# Patient Record
Sex: Male | Born: 1980 | Hispanic: Yes | Marital: Married | Smoking: Never smoker
Health system: Southern US, Community
[De-identification: ages and names within clinical notes are randomized; demographics above are authoritative.]

---

## 2018-10-28 DIAGNOSIS — G8929 Other chronic pain: Secondary | ICD-10-CM | POA: Insufficient documentation

## 2018-11-18 DIAGNOSIS — M5136 Other intervertebral disc degeneration, lumbar region: Secondary | ICD-10-CM | POA: Insufficient documentation

## 2018-11-18 DIAGNOSIS — M5030 Other cervical disc degeneration, unspecified cervical region: Secondary | ICD-10-CM | POA: Insufficient documentation

## 2018-12-12 ENCOUNTER — Other Ambulatory Visit: Payer: Self-pay

## 2018-12-12 ENCOUNTER — Ambulatory Visit (INDEPENDENT_AMBULATORY_CARE_PROVIDER_SITE_OTHER): Payer: BC Managed Care – PPO | Admitting: Orthopaedic Surgery

## 2018-12-12 ENCOUNTER — Encounter: Payer: Self-pay | Admitting: Orthopaedic Surgery

## 2018-12-12 ENCOUNTER — Ambulatory Visit: Payer: Self-pay

## 2018-12-12 VITALS — BP 133/76 | HR 70 | Ht 64.0 in | Wt 148.0 lb

## 2018-12-12 DIAGNOSIS — G8929 Other chronic pain: Secondary | ICD-10-CM

## 2018-12-12 DIAGNOSIS — M545 Low back pain: Secondary | ICD-10-CM

## 2018-12-12 DIAGNOSIS — M542 Cervicalgia: Secondary | ICD-10-CM | POA: Diagnosis not present

## 2018-12-16 NOTE — Progress Notes (Signed)
Office Visit Note   Patient: Jack Hernandez           Date of Birth: [birth year]           MRN: [id number] Visit Date: 12/12/2018              Requested by: No referring provider defined for this encounter. PCP: Patient, No Pcp Per   Assessment & Plan: Visit Diagnoses:  1. Neck pain, chronic   2. Chronic midline low back pain without sciatica     Plan: We discussed maximum dosages recommended for Tylenol and ibuprofen.  Reviewed findings of his radiographs today and previous MRI reports.  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  We discussed looking for work activity that is less stressful for both his back as well as his neck.  If he develops progressive symptoms he would require reimaging.  We discussed the multiple levels involved with evidence of disc degeneration at his young age.  If he develops progressive symptoms he can return to consider reimaging.  Pathophysiology discussed.  Follow-Up Instructions: Return if symptoms worsen or fail to improve.   Orders:  Orders Placed This Encounter  Procedures  . XR Lumbar Spine 2-3 Views  . XR Cervical Spine 2 or 3 views   No orders of the defined types were placed in this encounter.     Procedures: No procedures performed   Clinical Data: No additional findings.   Subjective: Chief Complaint  Patient presents with  . Lower Back - Pain  . Neck - Pain    HPI 38 year old male seen with plaints of chronic back pain and chronic neck pain.  He states he had an [place] lumbar in 2017 [place].  He has had plain radiographs done at [facility].  He is used Tylenol and Naprosyn as well as cannabis oil for treatment.  Patient is a non-smoker.  He does have a history of alcoholism and depression.  He works in [employer].  He has discomfort with bending lifting. MRI report from [facility] done on 09/10/2015 showed left  foraminal disc herniation L3-4.  Disc bulge L4-5 with broad-based disc herniation protrusion into the right neural foramina moderate to severe right foraminal stenosis.  L5-S1 chronic disc bulge with posterior disc herniation indenting the ventral epidural fat with dorsal annular tear moderate neuroforaminal stenosis with facet arthropathy. MRI cervical spine 08/1415 showed C5-6 disc bulge with moderate bilateral neuroforaminal stenosis and C6-7 circumferential disc bulge with moderate to severe bilateral neuroforaminal stenosis.  Small disc bulge noted at C3-4 without compression. Patient denies fever or chills.  Review of Systems 14 point systems positive for chronic neck and low back pain.  Disc degeneration.  History of alcoholism and depression.  Otherwise negative as it pertains HPI.   Objective: Vital Signs: BP 133/76   Pulse 70   Ht 5\' 4"  (1.626 m)   Wt 148 lb (67.1 kg)   BMI 25.40 kg/m   Physical Exam Constitutional:      Appearance: He is well-developed.  HENT:     Head: Normocephalic and atraumatic.  Eyes:     Pupils: Pupils are equal, round, and reactive to light.  Neck:     Thyroid: No thyromegaly.     Trachea: No tracheal deviation.  Cardiovascular:     Rate and Rhythm: Normal rate.  Pulmonary:     Effort: Pulmonary effort is  normal.     Breath sounds: No wheezing.  Abdominal:     General: Bowel sounds are normal.     Palpations: Abdomen is soft.  Skin:    General: Skin is warm and dry.     Capillary Refill: Capillary refill takes less than 2 seconds.  Neurological:     Mental Status: He is alert and oriented to person, place, and time.  Psychiatric:        Behavior: Behavior normal.        Thought Content: Thought content normal.        Judgment: Judgment normal.     Ortho Exam patient has some brachial plexus tenderness both right and left negative Spurling upper extremity reflexes are 2+ and symmetrical no isolated motor weakness the upper extremities no  impingement of the shoulders.  He has some sciatic notch tenderness worse on the left than right negative straight leg raising 90 degrees he is able to heel and toe walk.  Anterior tib gastrocsoleus is strong no atrophy.  Distal pulses are 2+ and symmetrical.  Specialty Comments:  No specialty comments available.  Imaging: No results found.   PMFS History: Patient Active Problem List   Diagnosis Date Noted  . DDD (degenerative disc disease), cervical 11/18/2018  . Degeneration of lumbar intervertebral disc 11/18/2018  . Chronic low back pain 10/28/2018  . Chronic neck pain 10/28/2018   History reviewed. No pertinent past medical history.  History reviewed. No pertinent family history.  History reviewed. No pertinent surgical history. Social History   Occupational History  . Not on file  Tobacco Use  . Smoking status: Never Smoker  . Smokeless tobacco: Never Used  Substance and Sexual Activity  . Alcohol use: Not on file  . Drug use: Not on file  . Sexual activity: Not on file

## 2019-01-10 ENCOUNTER — Telehealth: Payer: Self-pay | Admitting: Radiology

## 2019-01-10 DIAGNOSIS — G8929 Other chronic pain: Secondary | ICD-10-CM

## 2019-01-10 NOTE — Telephone Encounter (Signed)
OK thanks ucall and see if cervical or lumbar.

## 2019-01-10 NOTE — Telephone Encounter (Signed)
Patient called and states that he was told to call back if he was getting no better and we would order MRI. He would like for this order to be entered.  He is not claustrophobic and does not have any metal that would prohibit the scan. He would like scan to be done at [facility].  Please advise. CB for patient is [phone]

## 2019-01-14 NOTE — Telephone Encounter (Signed)
I left message requesting return call from patient to advise whether neck or low back. Patient can leave message for me and I will enter order and Forestine Na will call to schedule.

## 2019-01-21 NOTE — Addendum Note (Signed)
Addended by: Meyer Cory on: 01/21/2019 04:48 PM   Modules accepted: Orders

## 2019-01-21 NOTE — Telephone Encounter (Signed)
I left voicemail again requesting return call. Will wait for call back.

## 2019-01-21 NOTE — Telephone Encounter (Signed)
Patient returned call and requests MRI on Lumbar Spine. Order entered for [facility].

## 2019-01-30 ENCOUNTER — Ambulatory Visit (HOSPITAL_COMMUNITY): Payer: BC Managed Care – PPO | Attending: Orthopaedic Surgery

## 2019-02-26 ENCOUNTER — Ambulatory Visit (HOSPITAL_COMMUNITY): Payer: BC Managed Care – PPO

## 2019-02-27 ENCOUNTER — Ambulatory Visit (HOSPITAL_COMMUNITY)
Admission: RE | Admit: 2019-02-27 | Discharge: 2019-02-27 | Disposition: A | Discharge status: Home / Self Care | Payer: BC Managed Care – PPO | Source: Ambulatory Visit | Attending: Orthopaedic Surgery | Admitting: Orthopaedic Surgery

## 2019-02-27 ENCOUNTER — Other Ambulatory Visit: Payer: Self-pay

## 2019-02-27 DIAGNOSIS — M545 Low back pain: Secondary | ICD-10-CM | POA: Diagnosis present

## 2019-02-27 DIAGNOSIS — G8929 Other chronic pain: Secondary | ICD-10-CM | POA: Insufficient documentation

## 2019-04-29 ENCOUNTER — Encounter: Payer: Self-pay | Admitting: Orthopaedic Surgery

## 2019-04-29 ENCOUNTER — Ambulatory Visit (INDEPENDENT_AMBULATORY_CARE_PROVIDER_SITE_OTHER): Payer: BC Managed Care – PPO | Admitting: Orthopaedic Surgery

## 2019-04-29 ENCOUNTER — Other Ambulatory Visit: Payer: Self-pay

## 2019-04-29 ENCOUNTER — Ambulatory Visit: Payer: Self-pay

## 2019-04-29 VITALS — Ht 64.0 in | Wt 148.0 lb

## 2019-04-29 DIAGNOSIS — M545 Low back pain, unspecified: Secondary | ICD-10-CM

## 2019-04-29 DIAGNOSIS — M5126 Other intervertebral disc displacement, lumbar region: Secondary | ICD-10-CM | POA: Insufficient documentation

## 2019-04-29 DIAGNOSIS — G8929 Other chronic pain: Secondary | ICD-10-CM | POA: Diagnosis not present

## 2019-04-29 DIAGNOSIS — M25552 Pain in left hip: Secondary | ICD-10-CM | POA: Diagnosis not present

## 2019-04-29 NOTE — Progress Notes (Addendum)
Office Visit Note   Patient: Jack Hernandez           Date of Birth: 08-15-1980           MRN: [id number] Visit Date: 04/29/2019              Requested by: No referring provider defined for this encounter. PCP: Patient, No Pcp Per   Assessment & Plan: Visit Diagnoses:  1. Pain in left hip   2. Chronic left-sided low back pain, unspecified whether sciatica present   3. Protrusion of lumbar intervertebral disc     Plan: X-rays were obtained of the hip since he had limitation of internal rotation and pain.  This showed normal hip joint.  We reviewed the MRI scan with him I gave him a copy of his report.  He has some Schmorl's nodes that are present with early disc degeneration and edema in the vertebral endplates which may be contributing to his back pain.  At L5-S1 there is a central disc protrusion worse on the left adjacent to the S1 nerve root which may be the source of his back and left buttocks pain.  We will set him up for single epidural injection foraminal L5 with Dr. Alvester Morin and I can follow-up with him in 2 months.  We discussed disc degeneration.  Currently no surgery is indicated.  He will continue the anti-inflammatories and Tylenol.  Follow-Up Instructions: Return in about 2 months (around 06/30/2019).   Orders:  Orders Placed This Encounter  Procedures  . XR HIP UNILAT W OR W/O PELVIS 2-3 VIEWS LEFT  . Ambulatory referral to Physical Medicine Rehab   No orders of the defined types were placed in this encounter.     Procedures: No procedures performed   Clinical Data: No additional findings.   Subjective: Chief Complaint  Patient presents with  . Lower Back - Pain, Follow-up    MRI review    HPI 38 year old male returns with ongoing problems with chronic back pain.  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  Previous MRI 2017 showed some  foraminal protrusion at L3-4 disc bulge at L4-5 chronic disc bulge L5S1.  He returns today for follow-up new lumbar MRI scan for evaluation.  He continues to have neck pain symptoms and has some disc bulging noted on previous cervical MRI scan.  Review of Systems positive chronic neck and back pain.  History of alcoholism history of depression.  Negative for MI, chest pain, negative for stroke.   Objective: Vital Signs: Ht 5\' 4"  (1.626 m)   Wt 148 lb (67.1 kg)   BMI 25.40 kg/m   Physical Exam Constitutional:      Appearance: He is well-developed.  HENT:     Head: Normocephalic and atraumatic.  Eyes:     Pupils: Pupils are equal, round, and reactive to light.  Neck:     Thyroid: No thyromegaly.     Trachea: No tracheal deviation.  Cardiovascular:     Rate and Rhythm: Normal rate.  Pulmonary:     Effort: Pulmonary effort is normal.     Breath sounds: No wheezing.  Abdominal:     General: Bowel sounds are normal.     Palpations: Abdomen is soft.  Skin:    General: Skin is warm and dry.     Capillary Refill: Capillary refill takes less  than 2 seconds.  Neurological:     Mental Status: He is alert and oriented to person, place, and time.  Psychiatric:        Behavior: Behavior normal.        Thought Content: Thought content normal.        Judgment: Judgment normal.     Ortho Exam patient is ambulate with a slightly short stride gait.  He has only 10 degrees internal rotation left hip with significant pain.  Minimal trochanteric bursal tenderness no sciatic notch tenderness negative straight leg raising 90 degrees on the left and right.  Knee and ankle jerk are intact. Opposite right hip has 30 degrees internal rotation without pain and 45 degrees external rotation without discomfort. Specialty Comments:  No specialty comments available.  Imaging: CLINICAL DATA:  Initial evaluation for chronic low back pain with left hip and leg pain for 3 years.  EXAM: MRI LUMBAR SPINE  WITHOUT CONTRAST  TECHNIQUE: Multiplanar, multisequence MR imaging of the lumbar spine was performed. No intravenous contrast was administered.  COMPARISON:  Prior radiograph from 12/12/2018.  FINDINGS: Segmentation: Standard. Lowest well-formed disc labeled the L5-S1 level.  Alignment: Straightening of the normal lumbar lordosis. No listhesis.  Vertebrae: Vertebral body height maintained without evidence for acute or chronic fracture. Multiple endplate Schmorl's nodes with associated reactive marrow edema seen throughout the lumbar spine, most notable at L2-3 and L4-5. Underlying bone marrow signal intensity within normal limits. No discrete or worrisome osseous lesion. No other abnormal marrow edema.  Conus medullaris and cauda equina: Conus extends to the L1 level. Conus and cauda equina appear normal.  Paraspinal and other soft tissues: Paraspinous soft tissues within normal limits. Visualized visceral structures unremarkable.  Disc levels:  L1-2:  Unremarkable.  L2-3: Mild intervertebral disc space narrowing with disc bulge and disc desiccation. Prominent endplate Schmorl's node deformities. No focal disc herniation. No stenosis or impingement.  L3-4: Mild intervertebral disc space narrowing with circumferential disc bulge and disc desiccation. Mild endplate Schmorl's node deformity. No focal disc herniation. Mild facet hypertrophy. No canal or foraminal stenosis.  L4-5: Mild diffuse disc bulge with disc desiccation and intervertebral disc space narrowing. Endplate Schmorl's node deformity. Mild facet hypertrophy. No significant canal or foraminal stenosis.  L5-S1: Chronic intervertebral disc space narrowing with diffuse disc bulge and disc desiccation. Superimposed small central disc protrusion, slightly asymmetric to the left. Protruding disc closely approximates the descending S1 nerve roots without frank neural impingement or displacement,  slightly greater on the left. No canal or significant lateral recess stenosis. Mild bilateral L5 foraminal narrowing.  IMPRESSION: 1. Small central disc protrusion at L5-S1, closely approximating the descending S1 nerve roots without frank neural impingement or displacement, slightly greater on the left. 2. Additional mild noncompressive disc bulging at L2-3 through L4-5 without significant stenosis or impingement. 3. Prominent endplate Schmorl's node deformities throughout the lumbar spine, most notable at L2-3 and L4-5. Finding could contribute to underlying lower back pain.   Electronically Signed   By: Jeannine Boga M.D.   On: 02/27/2019 19:58   PMFS History: Patient Active Problem List   Diagnosis Date Noted  . Protrusion of lumbar intervertebral disc 04/29/2019  . DDD (degenerative disc disease), cervical 11/18/2018  . Degeneration of lumbar intervertebral disc 11/18/2018  . Chronic low back pain 10/28/2018  . Chronic neck pain 10/28/2018   No past medical history on file.  No family history on file.  No past surgical history on file. Social History   Occupational  History  . Not on file  Tobacco Use  . Smoking status: Never Smoker  . Smokeless tobacco: Never Used  Substance and Sexual Activity  . Alcohol use: Not on file  . Drug use: Not on file  . Sexual activity: Not on file

## 2019-05-27 ENCOUNTER — Ambulatory Visit: Payer: Self-pay

## 2019-05-27 ENCOUNTER — Other Ambulatory Visit: Payer: Self-pay

## 2019-05-27 ENCOUNTER — Encounter: Payer: Self-pay | Admitting: Physical Medicine and Rehabilitation

## 2019-05-27 ENCOUNTER — Ambulatory Visit (INDEPENDENT_AMBULATORY_CARE_PROVIDER_SITE_OTHER): Payer: BC Managed Care – PPO | Admitting: Physical Medicine and Rehabilitation

## 2019-05-27 VITALS — BP 110/69 | HR 63

## 2019-05-27 DIAGNOSIS — M48061 Spinal stenosis, lumbar region without neurogenic claudication: Secondary | ICD-10-CM

## 2019-05-27 DIAGNOSIS — M5416 Radiculopathy, lumbar region: Secondary | ICD-10-CM

## 2019-05-27 MED ORDER — BETAMETHASONE SOD PHOS & ACET 6 (3-3) MG/ML IJ SUSP
12.0000 mg | Freq: Once | INTRAMUSCULAR | Status: AC
  Administered 2019-05-27: 12 mg

## 2019-05-27 NOTE — Progress Notes (Signed)
 .  Numeric Pain Rating Scale and Functional Assessment Average Pain 6   In the last MONTH (on 0-10 scale) has pain interfered with the following?  1. General activity like being  able to carry out your everyday physical activities such as walking, climbing stairs, carrying groceries, or moving a chair?  Rating(6)   +Driver, -BT, -Dye Allergies.  

## 2019-05-28 NOTE — Progress Notes (Signed)
Jack Hernandez - 38 y.o. male MRN [id number]  Date of birth: 05/07/1981  Office Visit Note: Visit Date: 05/27/2019 PCP: Patient, No Pcp Per Referred by: Marybelle Killings, MD  Subjective: Chief Complaint  Patient presents with  . Lower Back - Pain  . Left Thigh - Pain   HPI:  Jack Hernandez is a 38 y.o. male who comes in today For planned left L5 transforaminal epidural steroid injection at the request of Dr. Leana Gamer.  Patient having chronic worsening low back with left buttock and hip pain.  MRI evidence of foraminal narrowing at L5-S1.  Has failed conservative care otherwise.  Injection will be diagnostic and hopefully therapeutic.  ROS Otherwise per HPI.  Assessment & Plan: Visit Diagnoses:  1. Lumbar radiculopathy   2. Foraminal stenosis of lumbar region     Plan: No additional findings.   Meds & Orders:  Meds ordered this encounter  Medications  . betamethasone acetate-betamethasone sodium phosphate (CELESTONE) injection 12 mg    Orders Placed This Encounter  Procedures  . XR C-ARM NO REPORT  . Epidural Steroid injection    Follow-up: Return in about 1 month (around 06/27/2019) for Jack Perna, MD.   Procedures: No procedures performed  Lumbosacral Transforaminal Epidural Steroid Injection - Sub-Pedicular Approach with Fluoroscopic Guidance  Patient: Jack Hernandez      Date of Birth: 1980-07-11 MRN: [id number] PCP: Patient, No Pcp Per      Visit Date: 05/27/2019   Universal Protocol:    Date/Time: 05/27/2019  Consent Given By: the patient  Position: PRONE  Additional Comments: Vital signs were monitored before and after the procedure. Patient was prepped and draped in the usual sterile fashion. The correct patient, procedure, and site was verified.   Injection Procedure Details:  Procedure Site One Meds Administered:  Meds ordered this encounter  Medications  . betamethasone acetate-betamethasone sodium phosphate (CELESTONE) injection 12 mg     Laterality: Left  Location/Site:  L5-S1  Needle size: 22 G  Needle type: Spinal  Needle Placement: Transforaminal  Findings:    -Comments: Excellent flow of contrast along the nerve and into the epidural space.  Procedure Details: After squaring off the end-plates to get a true AP view, the C-arm was positioned so that an oblique view of the foramen as noted above was visualized. The target area is just inferior to the "nose of the scotty dog" or sub pedicular. The soft tissues overlying this structure were infiltrated with 2-3 ml. of 1% Lidocaine without Epinephrine.  The spinal needle was inserted toward the target using a "trajectory" view along the fluoroscope beam.  Under AP and lateral visualization, the needle was advanced so it did not puncture dura and was located close the 6 O'Clock position of the pedical in AP tracterory. Biplanar projections were used to confirm position. Aspiration was confirmed to be negative for CSF and/or blood. A 1-2 ml. volume of Isovue-250 was injected and flow of contrast was noted at each level. Radiographs were obtained for documentation purposes.   After attaining the desired flow of contrast documented above, a 0.5 to 1.0 ml test dose of 0.25% Marcaine was injected into each respective transforaminal space.  The patient was observed for 90 seconds post injection.  After no sensory deficits were reported, and normal lower extremity motor function was noted,   the above injectate was administered so that equal amounts of the injectate were placed at each foramen (level) into the transforaminal epidural space.   Additional Comments:  The patient tolerated the procedure well Dressing: 2 x 2 sterile gauze and Band-Aid    Post-procedure details: Patient was observed during the procedure. Post-procedure instructions were reviewed.  Patient left the clinic in stable condition.      Clinical History: No specialty comments available.      Objective:  VS:  HT:    WT:   BMI:     BP:110/69  HR:63bpm  TEMP: ( )  RESP:  Physical Exam  Ortho Exam Imaging: XR C-ARM NO REPORT  Result Date: 05/27/2019 Please see Notes tab for imaging impression.

## 2019-05-28 NOTE — Procedures (Signed)
Lumbosacral Transforaminal Epidural Steroid Injection - Sub-Pedicular Approach with Fluoroscopic Guidance  Patient: Jack Hernandez      Date of Birth: 15-Mar-1981 MRN: [id number] PCP: Patient, No Pcp Per      Visit Date: 05/27/2019   Universal Protocol:    Date/Time: 05/27/2019  Consent Given By: the patient  Position: PRONE  Additional Comments: Vital signs were monitored before and after the procedure. Patient was prepped and draped in the usual sterile fashion. The correct patient, procedure, and site was verified.   Injection Procedure Details:  Procedure Site One Meds Administered:  Meds ordered this encounter  Medications  . betamethasone acetate-betamethasone sodium phosphate (CELESTONE) injection 12 mg    Laterality: Left  Location/Site:  L5-S1  Needle size: 22 G  Needle type: Spinal  Needle Placement: Transforaminal  Findings:    -Comments: Excellent flow of contrast along the nerve and into the epidural space.  Procedure Details: After squaring off the end-plates to get a true AP view, the C-arm was positioned so that an oblique view of the foramen as noted above was visualized. The target area is just inferior to the "nose of the scotty dog" or sub pedicular. The soft tissues overlying this structure were infiltrated with 2-3 ml. of 1% Lidocaine without Epinephrine.  The spinal needle was inserted toward the target using a "trajectory" view along the fluoroscope beam.  Under AP and lateral visualization, the needle was advanced so it did not puncture dura and was located close the 6 O'Clock position of the pedical in AP tracterory. Biplanar projections were used to confirm position. Aspiration was confirmed to be negative for CSF and/or blood. A 1-2 ml. volume of Isovue-250 was injected and flow of contrast was noted at each level. Radiographs were obtained for documentation purposes.   After attaining the desired flow of contrast documented above, a 0.5 to  1.0 ml test dose of 0.25% Marcaine was injected into each respective transforaminal space.  The patient was observed for 90 seconds post injection.  After no sensory deficits were reported, and normal lower extremity motor function was noted,   the above injectate was administered so that equal amounts of the injectate were placed at each foramen (level) into the transforaminal epidural space.   Additional Comments:  The patient tolerated the procedure well Dressing: 2 x 2 sterile gauze and Band-Aid    Post-procedure details: Patient was observed during the procedure. Post-procedure instructions were reviewed.  Patient left the clinic in stable condition.

## 2019-06-27 ENCOUNTER — Ambulatory Visit: Payer: BC Managed Care – PPO | Admitting: Orthopaedic Surgery

## 2019-06-27 ENCOUNTER — Other Ambulatory Visit: Payer: Self-pay

## 2019-06-27 VITALS — BP 119/71 | HR 57 | Ht 64.0 in | Wt 148.0 lb

## 2019-06-27 DIAGNOSIS — M5136 Other intervertebral disc degeneration, lumbar region: Secondary | ICD-10-CM | POA: Diagnosis not present

## 2019-06-27 NOTE — Progress Notes (Signed)
Office Visit Note   Patient: Jack Hernandez           Date of Birth: 1980/09/20           MRN: [id number] Visit Date: 06/27/2019              Requested by: No referring provider defined for this encounter. PCP: Patient, No Pcp Per   Assessment & Plan: Visit Diagnoses:  1. Degeneration of lumbar intervertebral disc     Plan: Patient has lumbar disc degeneration without compressive stenosis.  L2-3 and L3-4 shows edema of the endplates.  He has a small disc protrusion L5-S1 worse on the left but the injection did not help him.  When he goes back to therapy could try TENS unit again and then purchase TENS unit that he could use at home on his own intermittently.  At this point no surgery is recommended.  Follow-up as needed.  We discussed looking for work activity being he can do some sitting from standing moving around but avoiding repetitive bending and twisting.  Follow-Up Instructions: Return if symptoms worsen or fail to improve.   Orders:  No orders of the defined types were placed in this encounter.  No orders of the defined types were placed in this encounter.     Procedures: No procedures performed   Clinical Data: No additional findings.   Subjective: Chief Complaint  Patient presents with  . 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  He had an epidural 05/27/2019 and got no relief.  This was on the left at L5-S1 he primarily has left side buttocks pain does not radiate down his leg.  Previous MRI 02/27/2019 showed small central disc protrusion L5S1 more prominent on the left than right.  He also had some minimal disc bulge and endplate edema around Schmorl's nodes at L2-3 and L3-4 without stenosis, central lateral or foraminal.  He has been through therapy and goes back whenever he is having increased problems.  He has increased problems when he sits he does better when he moves around changes positions.  He gets relief in supine  position.  No bowel bladder symptoms no chills or fever.  Review of Systems 14 point update unchanged from 04/29/2019 visit.   Objective: Vital Signs: BP 119/71   Pulse (!) 57   Ht 5\' 4"  (1.626 m)   Wt 148 lb (67.1 kg)   BMI 25.40 kg/m   Physical Exam Constitutional:      Appearance: He is well-developed.  HENT:     Head: Normocephalic and atraumatic.  Eyes:     Pupils: Pupils are equal, round, and reactive to light.  Neck:     Thyroid: No thyromegaly.     Trachea: No tracheal deviation.  Cardiovascular:     Rate and Rhythm: Normal rate.  Pulmonary:     Effort: Pulmonary effort is normal.     Breath sounds: No wheezing.  Abdominal:     General: Bowel sounds are normal.     Palpations: Abdomen is soft.  Skin:    General: Skin is warm and dry.     Capillary Refill: Capillary refill takes less than 2 seconds.  Neurological:     Mental Status: He is alert and oriented to person, place, and time.  Psychiatric:        Behavior: Behavior normal.        Thought Content: Thought content normal.  Judgment: Judgment normal.     Ortho Exam patient is able to get from sitting to standing he does heel and toe walking without weakness.  Increased discomfort with forward bending which is the left paralumbar region L4-S1.  Negative straight leg raising 90 degrees.  No atrophy.  No rash over exposed skin.  Increased discomfort with turning twisting.  Specialty Comments:  No specialty comments available.  Imaging: No results found.   PMFS History: Patient Active Problem List   Diagnosis Date Noted  . Protrusion of lumbar intervertebral disc 04/29/2019  . DDD (degenerative disc disease), cervical 11/18/2018  . Degeneration of lumbar intervertebral disc 11/18/2018  . Chronic low back pain 10/28/2018  . Chronic neck pain 10/28/2018   No past medical history on file.  No family history on file.  No past surgical history on file. Social History   Occupational History   . Not on file  Tobacco Use  . Smoking status: Never Smoker  . Smokeless tobacco: Never Used  Substance and Sexual Activity  . Alcohol use: Not on file  . Drug use: Not on file  . Sexual activity: Not on file

## 2019-11-27 IMAGING — MR MR LUMBAR SPINE W/O CM
4 series · 12 of 48 positions shown · non-contrast
Comparison: Prior radiograph from 12/12/2018.

CLINICAL DATA: Initial evaluation for chronic low back pain with
left hip and leg pain for 3 years.

EXAM:
MRI LUMBAR SPINE WITHOUT CONTRAST
TECHNIQUE: Multiplanar, multisequence MR imaging of the lumbar spine was
performed. No intravenous contrast was administered.

[Series 3: T2 · sagittal · 4.0mm · 0.42mm/px · 3 of 15 slices shown]
[im 2/15]
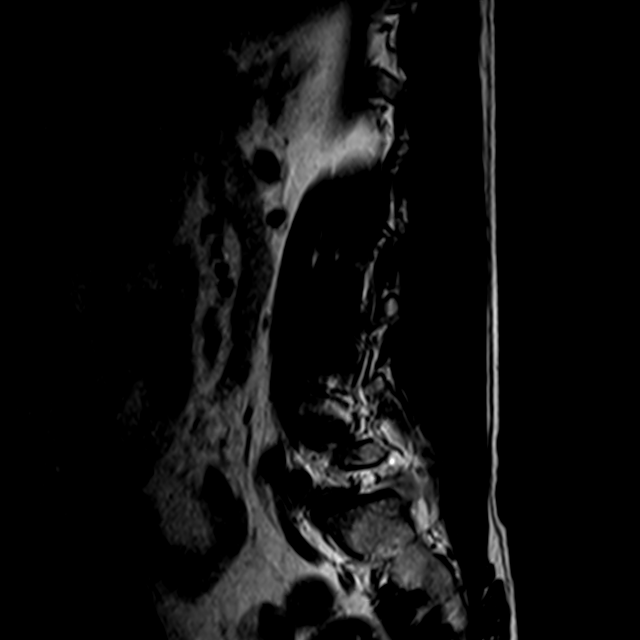
[im 8/15]
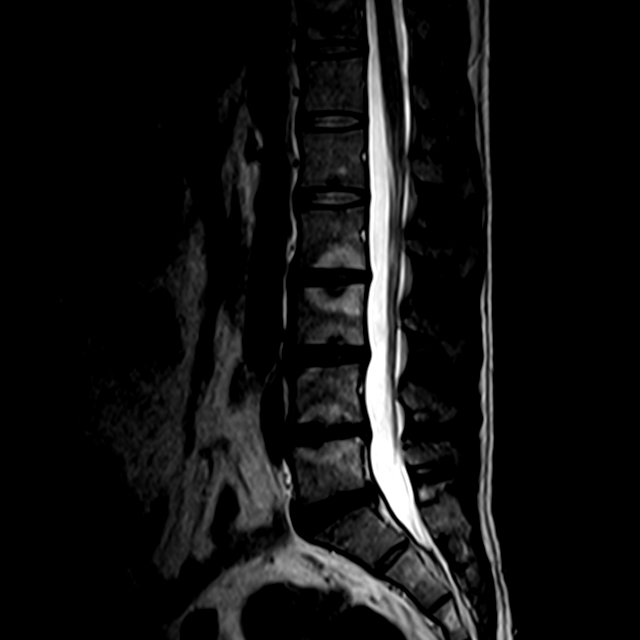
[im 13/15]
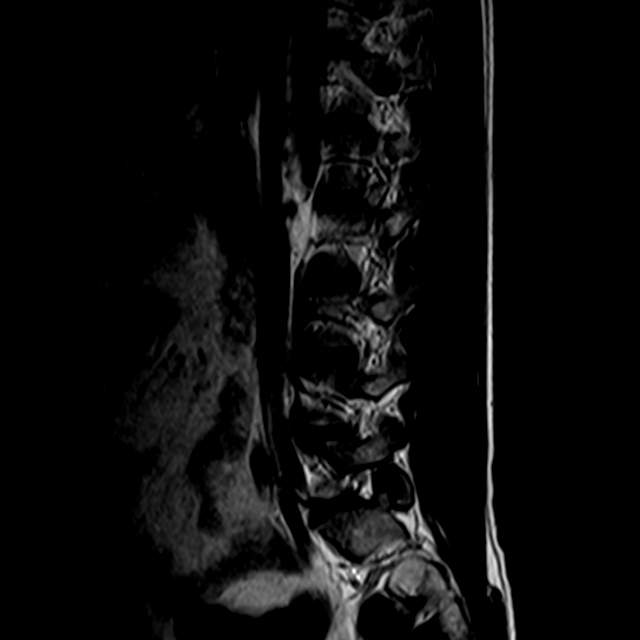

[Series 4: T1 · sagittal · 4.0mm · 0.42mm/px · 3 of 15 slices shown (1 of 2)]
[im 3/15]
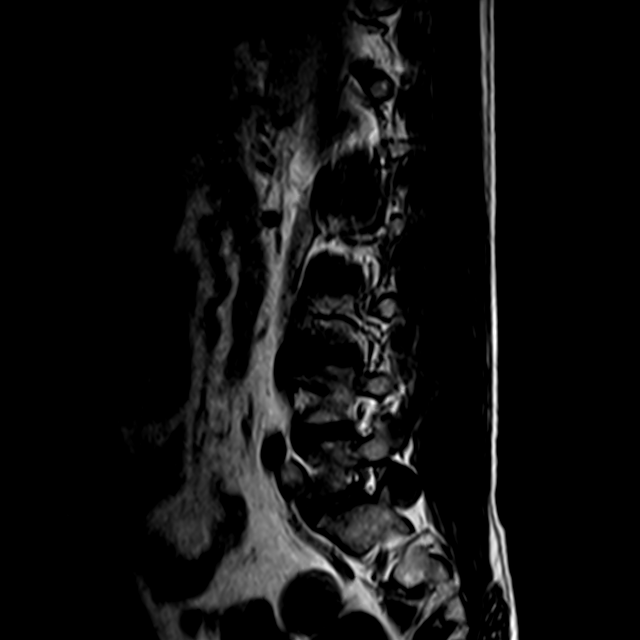
[im 9/15]
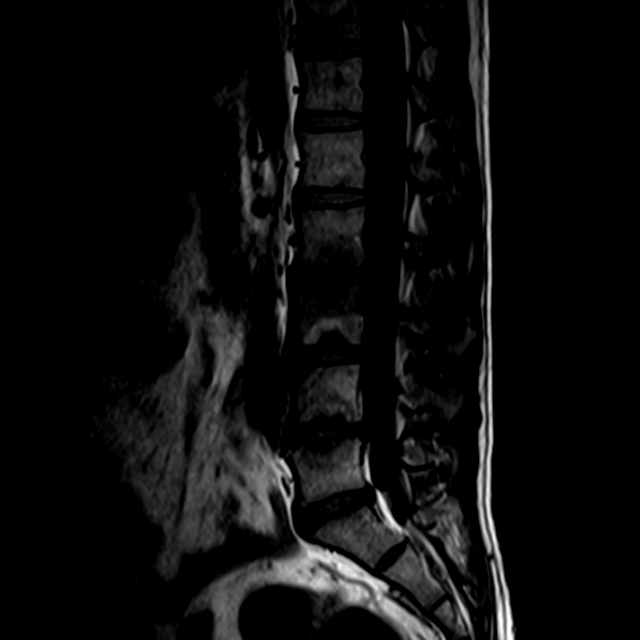
[im 13/15]
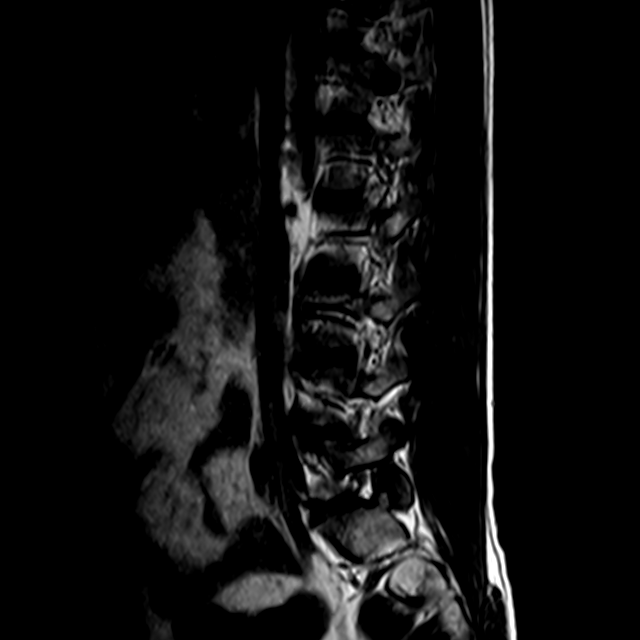

[Series 5: STIR · sagittal · 4.0mm · 0.42mm/px · 3 of 18 slices shown]
[im 2/18]
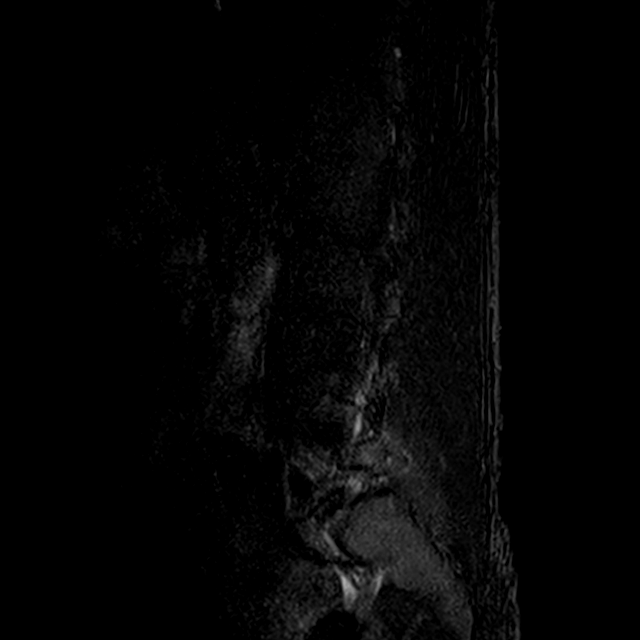
[im 10/18]
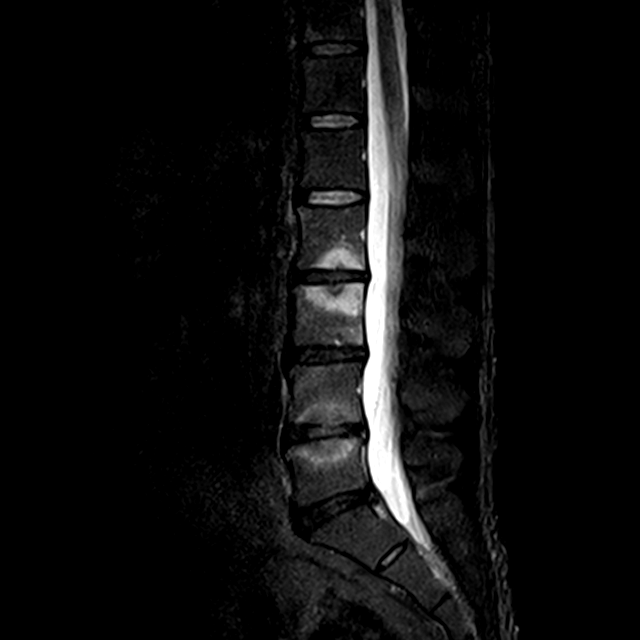
[im 16/18]
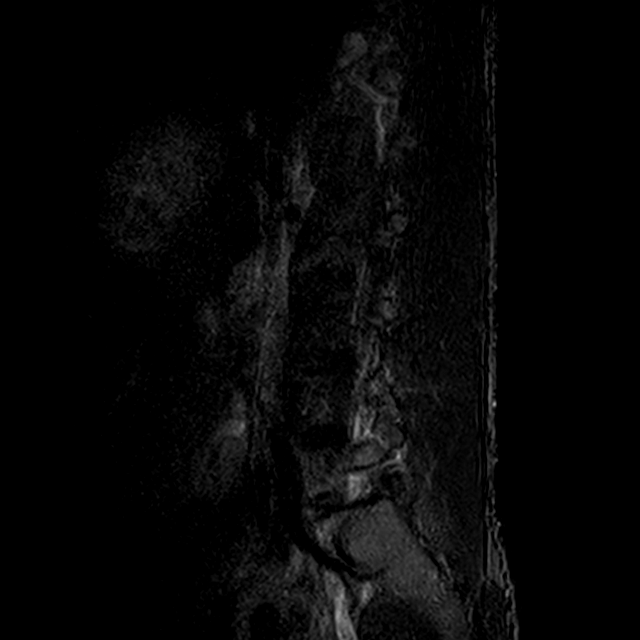

[Series 7: T1 · axial · 4.0mm · 0.23mm/px · z∈[-26,+95]mm · 3 of 38 slices shown (2 of 2)]
[im 6/38]
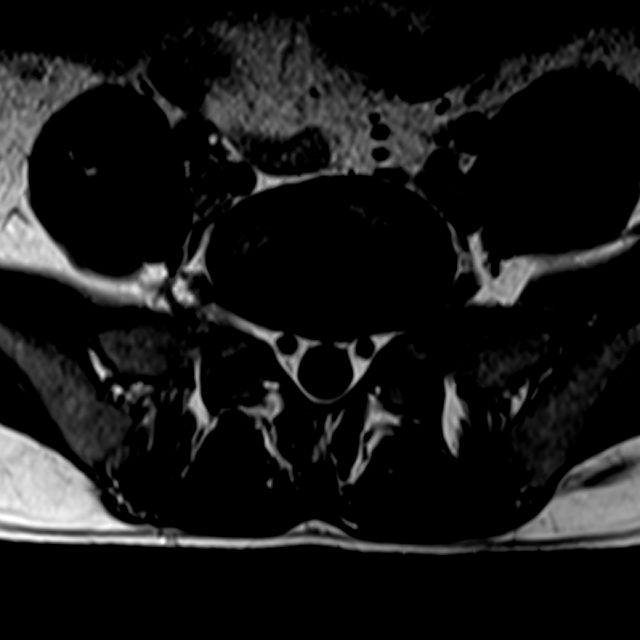
[im 19/38]
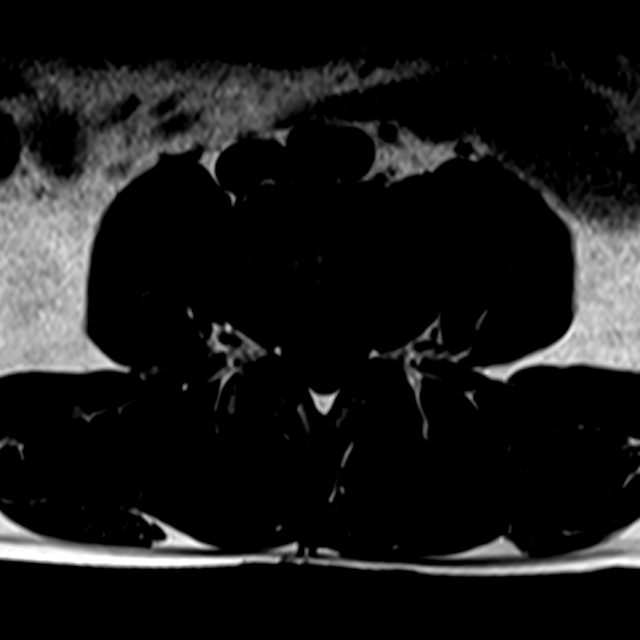
[im 32/38]
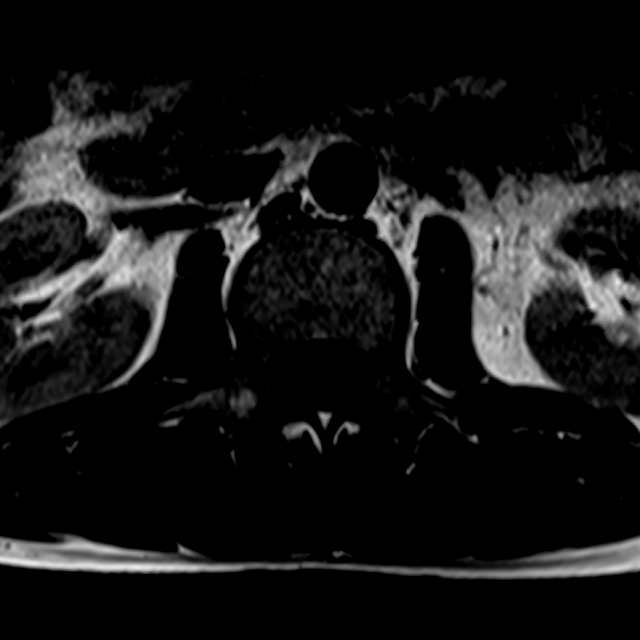

[12 of 48 positions shown; findings below may reference images not displayed]

FINDINGS: Segmentation: Standard. Lowest well-formed disc labeled the L5-S1
level.

Alignment: Straightening of the normal lumbar lordosis. No
listhesis.

Vertebrae: Vertebral body height maintained without evidence for
acute or chronic fracture. Multiple endplate Schmorl's nodes with
associated reactive marrow edema seen throughout the lumbar spine,
most notable at L2-3 and L4-5. Underlying bone marrow signal
intensity within normal limits. No discrete or worrisome osseous
lesion. No other abnormal marrow edema.

Conus medullaris and cauda equina: Conus extends to the L1 level.
Conus and cauda equina appear normal.

Paraspinal and other soft tissues: Paraspinous soft tissues within
normal limits. Visualized visceral structures unremarkable.

Disc levels:

L1-2:  Unremarkable.

L2-3: Mild intervertebral disc space narrowing with disc bulge and
disc desiccation. Prominent endplate Schmorl's node deformities. No
focal disc herniation. No stenosis or impingement.

L3-4: Mild intervertebral disc space narrowing with circumferential
disc bulge and disc desiccation. Mild endplate Schmorl's node
deformity. No focal disc herniation. Mild facet hypertrophy. No
canal or foraminal stenosis.

L4-5: Mild diffuse disc bulge with disc desiccation and
intervertebral disc space narrowing. Endplate Schmorl's node
deformity. Mild facet hypertrophy. No significant canal or foraminal
stenosis.

L5-S1: Chronic intervertebral disc space narrowing with diffuse disc
bulge and disc desiccation. Superimposed small central disc
protrusion, slightly asymmetric to the left. Protruding disc closely
approximates the descending S1 nerve roots without frank neural
impingement or displacement, slightly greater on the left. No canal
or significant lateral recess stenosis. Mild bilateral L5 foraminal
narrowing.
IMPRESSION: 1. Small central disc protrusion at L5-S1, closely approximating the
descending S1 nerve roots without frank neural impingement or
displacement, slightly greater on the left.
2. Additional mild noncompressive disc bulging at L2-3 through L4-5
without significant stenosis or impingement.
3. Prominent endplate Schmorl's node deformities throughout the
lumbar spine, most notable at L2-3 and L4-5. Finding could
contribute to underlying lower back pain.

## 2021-06-21 ENCOUNTER — Ambulatory Visit: Payer: BC Managed Care – PPO | Admitting: Internal Medicine

## 2021-07-06 ENCOUNTER — Encounter (INDEPENDENT_AMBULATORY_CARE_PROVIDER_SITE_OTHER): Payer: Self-pay

## 2021-07-06 ENCOUNTER — Other Ambulatory Visit: Payer: Self-pay

## 2021-07-06 ENCOUNTER — Encounter: Payer: Self-pay | Admitting: Internal Medicine

## 2021-07-06 ENCOUNTER — Ambulatory Visit: Payer: Self-pay | Admitting: Internal Medicine

## 2021-07-06 VITALS — BP 108/72 | HR 63 | Resp 18 | Ht 64.0 in | Wt 153.1 lb

## 2021-07-06 DIAGNOSIS — M940 Chondrocostal junction syndrome [Tietze]: Secondary | ICD-10-CM

## 2021-07-06 DIAGNOSIS — R319 Hematuria, unspecified: Secondary | ICD-10-CM | POA: Insufficient documentation

## 2021-07-06 DIAGNOSIS — M5430 Sciatica, unspecified side: Secondary | ICD-10-CM

## 2021-07-06 DIAGNOSIS — M7918 Myalgia, other site: Secondary | ICD-10-CM

## 2021-07-06 DIAGNOSIS — Z0001 Encounter for general adult medical examination with abnormal findings: Secondary | ICD-10-CM

## 2021-07-06 DIAGNOSIS — Z2821 Immunization not carried out because of patient refusal: Secondary | ICD-10-CM

## 2021-07-06 HISTORY — DX: Myalgia, other site: M79.18

## 2021-07-06 HISTORY — DX: Sciatica, unspecified side: M54.30

## 2021-07-06 NOTE — Patient Instructions (Signed)
Please take Ibuprofen for muscle soreness of chest wall area.

## 2021-07-06 NOTE — Assessment & Plan Note (Signed)
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

## 2021-07-06 NOTE — Assessment & Plan Note (Signed)
Physical exam as documented. Fasting blood test today Check UA for history of hematuria Refused flu vaccine.

## 2021-07-06 NOTE — Assessment & Plan Note (Signed)
History of nocturnal hematuria We will check UA today If persistent hematuria, will refer to urology

## 2021-07-06 NOTE — Progress Notes (Signed)
New Patient Office Visit  Subjective:  Patient ID: Jack Hernandez, male    DOB: 06-25-1980  Age: 41 y.o. MRN: [id number]  CC:  Chief Complaint  Patient presents with   New Patient (Initial Visit)    New patient     HPI Jack Hernandez is a 41 y.o. male with past medical history of DDD of lumbar spine who presents for establishing care.  He recently had an episode of right-sided chest wall pain and was having pleuritic pain with deep breathing and coughing.  It has resolved now.  Denies any dyspnea or wheezing currently.  Does not smoke.  He has had episodes of hematuria, especially at nighttime.  He has been to the ER in the past and was referred to urology for it.  But he did not follow up with Urology.  He denies any dysuria, flank pain, urethral pain or discharge currently.  He has not had COVID or flu vaccines.  He refused flu vaccine today.  Past Medical History:  Diagnosis Date   Myofascial pain 07/06/2021   Sciatica 07/06/2021    History reviewed. No pertinent surgical history.  History reviewed. No pertinent family history.  Social History   Socioeconomic History   Marital status: Married    Spouse name: Not on file   Number of children: Not on file   Years of education: Not on file   Highest education level: Not on file  Occupational History   Not on file  Tobacco Use   Smoking status: Never   Smokeless tobacco: Never  Substance and Sexual Activity   Alcohol use: Not on file   Drug use: Not on file   Sexual activity: Not on file  Other Topics Concern   Not on file  Social History Narrative   Not on file   Social Determinants of Health   Financial Resource Strain: Not on file  Food Insecurity: Not on file  Transportation Needs: Not on file  Physical Activity: Not on file  Stress: Not on file  Social Connections: Not on file  Intimate Partner Violence: Not on file    ROS Review of Systems  Constitutional:  Negative for chills and fever.  HENT:   Negative for congestion and sore throat.   Eyes:  Negative for pain and discharge.  Respiratory:  Negative for cough and shortness of breath.   Cardiovascular:  Negative for chest pain and palpitations.  Gastrointestinal:  Negative for constipation, diarrhea, nausea and vomiting.  Endocrine: Negative for polydipsia and polyuria.  Genitourinary:  Positive for hematuria. Negative for dysuria.  Musculoskeletal:  Positive for back pain. Negative for neck pain and neck stiffness.  Skin:  Negative for rash.  Neurological:  Negative for dizziness, weakness, numbness and headaches.  Psychiatric/Behavioral:  Negative for agitation and behavioral problems.    Objective:   Today's Vitals: BP 108/72 (BP Location: Right Arm, Patient Position: Sitting, Cuff Size: Normal)    Pulse 63    Resp 18    Ht _0  (1.626 m)    Wt 153 lb 1.3 oz (69.4 kg)    SpO2 98%    BMI 26.28 kg/m   Physical Exam Vitals reviewed.  Constitutional:      General: He is not in acute distress.    Appearance: He is not diaphoretic.  HENT:     Head: Normocephalic and atraumatic.     Nose: Nose normal.     Mouth/Throat:     Mouth: Mucous membranes are moist.  Eyes:  General: No scleral icterus.    Extraocular Movements: Extraocular movements intact.  Cardiovascular:     Rate and Rhythm: Normal rate and regular rhythm.     Pulses: Normal pulses.     Heart sounds: Normal heart sounds. No murmur heard. Pulmonary:     Breath sounds: Normal breath sounds. No wheezing or rales.  Abdominal:     Palpations: Abdomen is soft.     Tenderness: There is no abdominal tenderness.  Musculoskeletal:     Cervical back: Neck supple. No tenderness.     Right lower leg: No edema.     Left lower leg: No edema.  Skin:    General: Skin is warm.     Findings: No rash.  Neurological:     General: No focal deficit present.     Mental Status: He is alert and oriented to person, place, and time.     Cranial Nerves: No cranial nerve  deficit.     Sensory: No sensory deficit.     Motor: No weakness.  Psychiatric:        Mood and Affect: Mood normal.        Behavior: Behavior normal.    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 Fasting blood test today Check UA for history of hematuria Refused flu vaccine.      Relevant Orders   VITAMIN D 25 Hydroxy (Vit-D Deficiency, Fractures)   TSH   Hemoglobin A1c   CMP14+EGFR   CBC with Differential/Platelet   Lipid Profile   Hematuria    History of nocturnal hematuria We will check UA today If persistent hematuria, will refer to urology      Relevant Orders   Urinalysis   Other Visit Diagnoses     Refused influenza vaccine           Outpatient Encounter Medications as of 07/06/2021  Medication Sig   acetaminophen (TYLENOL) 500 MG tablet Take 500 mg by mouth every 6 (six) hours as needed.   ibuprofen (ADVIL) 200 MG tablet Take 200 mg by mouth every 6 (six) hours as needed.   No facility-administered encounter medications on file as of 07/06/2021.    Follow-up: Return in about 1 year (around 07/06/2022) for Annual physical.   Lindell Spar, MD

## 2021-07-07 LAB — CBC WITH DIFFERENTIAL/PLATELET
Basophils Absolute: 0 10*3/uL (ref 0.0–0.2)
Basos: 1 %
EOS (ABSOLUTE): 0.1 10*3/uL (ref 0.0–0.4)
Eos: 2 %
Hematocrit: 46.8 % (ref 37.5–51.0)
Hemoglobin: 16.1 g/dL (ref 13.0–17.7)
Immature Grans (Abs): 0 10*3/uL (ref 0.0–0.1)
Immature Granulocytes: 0 %
Lymphocytes Absolute: 2.7 10*3/uL (ref 0.7–3.1)
Lymphs: 45 %
MCH: 28.9 pg (ref 26.6–33.0)
MCHC: 34.4 g/dL (ref 31.5–35.7)
MCV: 84 fL (ref 79–97)
Monocytes Absolute: 0.3 10*3/uL (ref 0.1–0.9)
Monocytes: 6 %
Neutrophils Absolute: 2.9 10*3/uL (ref 1.4–7.0)
Neutrophils: 46 %
Platelets: 296 10*3/uL (ref 150–450)
RBC: 5.57 x10E6/uL (ref 4.14–5.80)
RDW: 12.6 % (ref 11.6–15.4)
WBC: 6 10*3/uL (ref 3.4–10.8)

## 2021-07-07 LAB — URINALYSIS
Bilirubin, UA: NEGATIVE
Glucose, UA: NEGATIVE
Ketones, UA: NEGATIVE
Leukocytes,UA: NEGATIVE
Nitrite, UA: NEGATIVE
Protein,UA: NEGATIVE
RBC, UA: NEGATIVE
Specific Gravity, UA: 1.022 (ref 1.005–1.030)
Urobilinogen, Ur: 0.2 mg/dL (ref 0.2–1.0)
pH, UA: 6 (ref 5.0–7.5)

## 2021-07-07 LAB — HEMOGLOBIN A1C
Est. average glucose Bld gHb Est-mCnc: 114 mg/dL
Hgb A1c MFr Bld: 5.6 % (ref 4.8–5.6)

## 2021-07-07 LAB — LIPID PANEL
Chol/HDL Ratio: 5 ratio (ref 0.0–5.0)
Cholesterol, Total: 249 mg/dL — ABNORMAL HIGH (ref 100–199)
HDL: 50 mg/dL (ref >39)
LDL Chol Calc (NIH): 133 mg/dL — ABNORMAL HIGH (ref 0–99)
Triglycerides: 370 mg/dL — ABNORMAL HIGH (ref 0–149)
VLDL Cholesterol Cal: 66 mg/dL — ABNORMAL HIGH (ref 5–40)

## 2021-07-07 LAB — CMP14+EGFR
ALT: 47 IU/L — ABNORMAL HIGH (ref 0–44)
AST: 37 IU/L (ref 0–40)
Albumin/Globulin Ratio: 1.9 (ref 1.2–2.2)
Albumin: 4.9 g/dL (ref 4.0–5.0)
Alkaline Phosphatase: 86 IU/L (ref 44–121)
BUN/Creatinine Ratio: 13 (ref 9–20)
BUN: 11 mg/dL (ref 6–24)
Bilirubin Total: 0.4 mg/dL (ref 0.0–1.2)
CO2: 20 mmol/L (ref 20–29)
Calcium: 9.9 mg/dL (ref 8.7–10.2)
Chloride: 99 mmol/L (ref 96–106)
Creatinine, Ser: 0.86 mg/dL (ref 0.76–1.27)
Globulin, Total: 2.6 g/dL (ref 1.5–4.5)
Glucose: 97 mg/dL (ref 70–99)
Potassium: 4.5 mmol/L (ref 3.5–5.2)
Sodium: 138 mmol/L (ref 134–144)
Total Protein: 7.5 g/dL (ref 6.0–8.5)
eGFR: 112 mL/min/{1.73_m2} (ref >59)

## 2021-07-07 LAB — VITAMIN D 25 HYDROXY (VIT D DEFICIENCY, FRACTURES): Vit D, 25-Hydroxy: 14.3 ng/mL — ABNORMAL LOW (ref 30.0–100.0)

## 2021-07-07 LAB — TSH: TSH: 1.56 u[IU]/mL (ref 0.450–4.500)

## 2022-07-11 ENCOUNTER — Encounter: Payer: Self-pay | Admitting: Internal Medicine

## 2022-07-13 ENCOUNTER — Encounter: Payer: Self-pay | Admitting: Internal Medicine

## 2023-02-26 ENCOUNTER — Ambulatory Visit: Payer: Managed Care, Other (non HMO) | Admitting: Internal Medicine

## 2023-02-28 ENCOUNTER — Encounter: Payer: Self-pay | Admitting: Internal Medicine

## 2023-04-02 ENCOUNTER — Encounter: Payer: Managed Care, Other (non HMO) | Admitting: Internal Medicine

## 2023-04-16 ENCOUNTER — Ambulatory Visit: Payer: Managed Care, Other (non HMO) | Admitting: Internal Medicine

## 2023-04-19 ENCOUNTER — Encounter: Payer: Self-pay | Admitting: Internal Medicine

## 2023-07-16 DIAGNOSIS — F1020 Alcohol dependence, uncomplicated: Secondary | ICD-10-CM | POA: Diagnosis not present

## 2023-07-17 DIAGNOSIS — F1020 Alcohol dependence, uncomplicated: Secondary | ICD-10-CM | POA: Diagnosis not present

## 2023-07-18 DIAGNOSIS — F1020 Alcohol dependence, uncomplicated: Secondary | ICD-10-CM | POA: Diagnosis not present

## 2023-07-20 DIAGNOSIS — F1020 Alcohol dependence, uncomplicated: Secondary | ICD-10-CM | POA: Diagnosis not present

## 2023-07-23 DIAGNOSIS — F1020 Alcohol dependence, uncomplicated: Secondary | ICD-10-CM | POA: Diagnosis not present

## 2023-07-24 DIAGNOSIS — F1020 Alcohol dependence, uncomplicated: Secondary | ICD-10-CM | POA: Diagnosis not present

## 2023-07-25 DIAGNOSIS — F1020 Alcohol dependence, uncomplicated: Secondary | ICD-10-CM | POA: Diagnosis not present

## 2023-07-27 DIAGNOSIS — F1020 Alcohol dependence, uncomplicated: Secondary | ICD-10-CM | POA: Diagnosis not present

## 2023-07-30 DIAGNOSIS — F1020 Alcohol dependence, uncomplicated: Secondary | ICD-10-CM | POA: Diagnosis not present

## 2023-07-31 DIAGNOSIS — F1020 Alcohol dependence, uncomplicated: Secondary | ICD-10-CM | POA: Diagnosis not present

## 2023-08-01 DIAGNOSIS — F1020 Alcohol dependence, uncomplicated: Secondary | ICD-10-CM | POA: Diagnosis not present

## 2023-08-02 DIAGNOSIS — F1020 Alcohol dependence, uncomplicated: Secondary | ICD-10-CM | POA: Diagnosis not present

## 2023-08-03 DIAGNOSIS — F1020 Alcohol dependence, uncomplicated: Secondary | ICD-10-CM | POA: Diagnosis not present

## 2023-08-06 DIAGNOSIS — F1020 Alcohol dependence, uncomplicated: Secondary | ICD-10-CM | POA: Diagnosis not present

## 2023-08-08 DIAGNOSIS — F1020 Alcohol dependence, uncomplicated: Secondary | ICD-10-CM | POA: Diagnosis not present

## 2023-08-10 DIAGNOSIS — F1020 Alcohol dependence, uncomplicated: Secondary | ICD-10-CM | POA: Diagnosis not present

## 2023-08-13 DIAGNOSIS — F1020 Alcohol dependence, uncomplicated: Secondary | ICD-10-CM | POA: Diagnosis not present

## 2023-08-14 ENCOUNTER — Ambulatory Visit: Payer: Self-pay | Admitting: Internal Medicine

## 2023-08-14 ENCOUNTER — Ambulatory Visit (HOSPITAL_COMMUNITY)
Admission: RE | Admit: 2023-08-14 | Discharge: 2023-08-14 | Disposition: A | Discharge status: Home / Self Care | Source: Ambulatory Visit | Attending: Internal Medicine | Admitting: Internal Medicine

## 2023-08-14 DIAGNOSIS — M25561 Pain in right knee: Secondary | ICD-10-CM | POA: Diagnosis not present

## 2023-08-14 DIAGNOSIS — G8929 Other chronic pain: Secondary | ICD-10-CM | POA: Diagnosis not present

## 2023-08-14 DIAGNOSIS — Z114 Encounter for screening for human immunodeficiency virus [HIV]: Secondary | ICD-10-CM | POA: Diagnosis not present

## 2023-08-14 DIAGNOSIS — Z1159 Encounter for screening for other viral diseases: Secondary | ICD-10-CM | POA: Diagnosis not present

## 2023-08-14 DIAGNOSIS — Z0001 Encounter for general adult medical examination with abnormal findings: Secondary | ICD-10-CM

## 2023-08-14 DIAGNOSIS — F10288 Alcohol dependence with other alcohol-induced disorder: Secondary | ICD-10-CM | POA: Diagnosis not present

## 2023-08-14 DIAGNOSIS — Z23 Encounter for immunization: Secondary | ICD-10-CM

## 2023-08-14 DIAGNOSIS — E785 Hyperlipidemia, unspecified: Secondary | ICD-10-CM

## 2023-08-14 DIAGNOSIS — E559 Vitamin D deficiency, unspecified: Secondary | ICD-10-CM

## 2023-08-14 NOTE — Assessment & Plan Note (Signed)
 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

## 2023-08-14 NOTE — Assessment & Plan Note (Signed)
 Last drink about 2 weeks ago Takes naltrexone since the last week, followed by [facility] behavioral therapy Jana Half) His mood symptoms are also likely due to alcohol dependence

## 2023-08-14 NOTE — Patient Instructions (Signed)
 Please take Tylenol as needed for knee pain.  Please get X-ray of knee done at [facility].

## 2023-08-14 NOTE — Assessment & Plan Note (Signed)
 Physical exam as documented. Fasting blood test today Tdap vaccine today. Refused flu vaccine.

## 2023-08-14 NOTE — Progress Notes (Signed)
 New Patient Office Visit  Subjective:  Patient ID: Jack Hernandez, male    DOB: 30-Apr-1981  Age: 43 y.o. MRN: [id number]  CC:  Chief Complaint  Patient presents with   Leg Pain    Pt reports leg discomfort , reports right knee pain popping and knee weakness at times   Care Management    Follow up.     HPI Jack Hernandez is a 43 y.o. male with past medical history of DDD of lumbar spine who presents for annual physical.  He reports right knee popping sounds especially while using stairs.  He feels like his legs giving out at times.  Denies any knee pain or swelling currently.  Denies any recent fall or injury.  He cuts trees as a profession, which requires heavy exertion.  He has history of DDD of lumbar spine.  He has history of daily alcohol intake, but has been avoiding it for the last 2 weeks.  He had an incident of DUI, currently seeing [facility] behavioral therapy for alcohol dependence.  He is taking naltrexone for it. His PHQ-9 was elevated today, but states that he has discussed about depression symptoms with psychiatric provider in [place].  He has not had COVID or flu vaccines.  He refused flu vaccine today.  Past Medical History:  Diagnosis Date   Myofascial pain 07/06/2021   Sciatica 07/06/2021    History reviewed. No pertinent surgical history.  History reviewed. No pertinent family history.  Social History   Socioeconomic History   Marital status: Married    Spouse name: Not on file   Number of children: Not on file   Years of education: Not on file   Highest education level: 8th grade  Occupational History   Not on file  Tobacco Use   Smoking status: Never   Smokeless tobacco: Never  Substance and Sexual Activity   Alcohol use: Not on file   Drug use: Not on file   Sexual activity: Not on file  Other Topics Concern   Not on file  Social History Narrative   Not on file   Social Drivers of Health   Financial Resource Strain: Medium Risk  (08/14/2023)   Overall Financial Resource Strain (CARDIA)    Difficulty of Paying Living Expenses: Somewhat hard  Food Insecurity: Food Insecurity Present (08/14/2023)   Hunger Vital Sign    Worried About Running Out of Food in the Last Year: Sometimes true    Ran Out of Food in the Last Year: Never true  Transportation Needs: No Transportation Needs (08/14/2023)   PRAPARE - Administrator, Civil Service (Medical): No    Lack of Transportation (Non-Medical): No  Physical Activity: Unknown (08/14/2023)   Exercise Vital Sign    Days of Exercise per Week: 3 days    Minutes of Exercise per Session: Not on file  Stress: Stress Concern Present (08/14/2023)   [employer] of Occupational Health - Occupational Stress Questionnaire    Feeling of Stress : To some extent  Social Connections: Moderately Integrated (08/14/2023)   Social Connection and Isolation Panel [NHANES]    Frequency of Communication with Friends and Family: More than three times a week    Frequency of Social Gatherings with Friends and Family: Twice a week    Attends Religious Services: More than 4 times per year    Active Member of [employer] or Organizations: No    Attends Banker Meetings: Not on file    Marital  Status: Married  Catering manager Violence: Not on file    ROS Review of Systems  Constitutional:  Negative for chills and fever.  HENT:  Negative for congestion and sore throat.   Eyes:  Negative for pain and discharge.  Respiratory:  Negative for cough and shortness of breath.   Cardiovascular:  Negative for chest pain and palpitations.  Gastrointestinal:  Negative for constipation, diarrhea, nausea and vomiting.  Endocrine: Negative for polydipsia and polyuria.  Genitourinary:  Negative for dysuria and hematuria.  Musculoskeletal:  Positive for back pain. Negative for neck pain and neck stiffness.       Right knee discomfort  Skin:  Negative for rash.  Neurological:  Negative for  dizziness, weakness, numbness and headaches.  Psychiatric/Behavioral:  Negative for agitation and behavioral problems.     Objective:   Today's Vitals: There were no vitals taken for this visit.  Physical Exam Vitals reviewed.  Constitutional:      General: He is not in acute distress.    Appearance: He is not diaphoretic.  HENT:     Head: Normocephalic and atraumatic.     Nose: Nose normal.     Mouth/Throat:     Mouth: Mucous membranes are moist.  Eyes:     General: No scleral icterus.    Extraocular Movements: Extraocular movements intact.  Cardiovascular:     Rate and Rhythm: Normal rate and regular rhythm.     Pulses: Normal pulses.     Heart sounds: Normal heart sounds. No murmur heard. Pulmonary:     Breath sounds: Normal breath sounds. No wheezing or rales.  Abdominal:     Palpations: Abdomen is soft.     Tenderness: There is no abdominal tenderness.  Musculoskeletal:     Cervical back: Neck supple. No tenderness.     Right knee: Crepitus present. No swelling or bony tenderness. Normal range of motion.     Right lower leg: No edema.     Left lower leg: No edema.  Skin:    General: Skin is warm.     Findings: No rash.  Neurological:     General: No focal deficit present.     Mental Status: He is alert and oriented to person, place, and time.     Cranial Nerves: No cranial nerve deficit.     Sensory: No sensory deficit.     Motor: No weakness.  Psychiatric:        Mood and Affect: Mood normal.        Behavior: Behavior normal.     Assessment & Plan:   Problem List Items Addressed This Visit       Other   Encounter for general adult medical examination with abnormal findings - Primary   Physical exam as documented. Fasting blood test today Tdap vaccine today. Refused flu vaccine.      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) Check x-ray of knee Advised to take Tylenol as needed for pain or discomfort If persistent, will provide orthopedic surgery  referral      Relevant Orders   DG Knee Complete 4 Views Right   Other Visit Diagnoses       Need for hepatitis C screening test       Relevant Orders   Hepatitis C Antibody     Encounter for screening for HIV       Relevant Orders   HIV antibody (with reflex)     Vitamin D deficiency       Relevant Orders   Vitamin D (25 hydroxy)     Hyperlipidemia, unspecified hyperlipidemia type       Relevant Orders   Lipid Profile     Encounter for immunization       Relevant Orders   Tdap vaccine greater than or equal to 7yo IM (Completed)       Outpatient Encounter Medications as of 08/14/2023  Medication Sig   naltrexone (DEPADE) 50 MG tablet Take 25 mg by mouth daily.   [DISCONTINUED] acetaminophen (TYLENOL) 500 MG tablet Take 500 mg by mouth every 6 (six) hours as needed.   [DISCONTINUED] ibuprofen (ADVIL) 200 MG tablet Take 200 mg by mouth every 6 (six) hours as needed.   No facility-administered encounter medications on file as of 08/14/2023.    Follow-up: Return in about 1 year (around 08/13/2024), or if symptoms worsen or fail to improve.   Anabel Halon, MD

## 2023-08-16 LAB — VITAMIN D 25 HYDROXY (VIT D DEFICIENCY, FRACTURES): Vit D, 25-Hydroxy: 22.1 ng/mL — ABNORMAL LOW (ref 30.0–100.0)

## 2023-08-16 LAB — CBC WITH DIFFERENTIAL/PLATELET
Basophils Absolute: 0.1 10*3/uL (ref 0.0–0.2)
Basos: 1 %
EOS (ABSOLUTE): 0.1 10*3/uL (ref 0.0–0.4)
Eos: 1 %
Hematocrit: 48.4 % (ref 37.5–51.0)
Hemoglobin: 16.1 g/dL (ref 13.0–17.7)
Immature Grans (Abs): 0 10*3/uL (ref 0.0–0.1)
Immature Granulocytes: 0 %
Lymphocytes Absolute: 2.6 10*3/uL (ref 0.7–3.1)
Lymphs: 44 %
MCH: 29.5 pg (ref 26.6–33.0)
MCHC: 33.3 g/dL (ref 31.5–35.7)
MCV: 89 fL (ref 79–97)
Monocytes Absolute: 0.3 10*3/uL (ref 0.1–0.9)
Monocytes: 6 %
Neutrophils Absolute: 2.8 10*3/uL (ref 1.4–7.0)
Neutrophils: 48 %
Platelets: 295 10*3/uL (ref 150–450)
RBC: 5.45 x10E6/uL (ref 4.14–5.80)
RDW: 13.2 % (ref 11.6–15.4)
WBC: 5.8 10*3/uL (ref 3.4–10.8)

## 2023-08-16 LAB — CMP14+EGFR
ALT: 21 IU/L (ref 0–44)
AST: 19 IU/L (ref 0–40)
Albumin: 4.8 g/dL (ref 4.1–5.1)
Alkaline Phosphatase: 78 IU/L (ref 44–121)
BUN/Creatinine Ratio: 10 (ref 9–20)
BUN: 9 mg/dL (ref 6–24)
Bilirubin Total: 0.5 mg/dL (ref 0.0–1.2)
CO2: 26 mmol/L (ref 20–29)
Calcium: 9.6 mg/dL (ref 8.7–10.2)
Chloride: 101 mmol/L (ref 96–106)
Creatinine, Ser: 0.86 mg/dL (ref 0.76–1.27)
Globulin, Total: 2.6 g/dL (ref 1.5–4.5)
Glucose: 89 mg/dL (ref 70–99)
Potassium: 4.4 mmol/L (ref 3.5–5.2)
Sodium: 142 mmol/L (ref 134–144)
Total Protein: 7.4 g/dL (ref 6.0–8.5)
eGFR: 110 mL/min/{1.73_m2} (ref >59)

## 2023-08-16 LAB — HEPATITIS C ANTIBODY: Hep C Virus Ab: NONREACTIVE

## 2023-08-16 LAB — LIPID PANEL
Chol/HDL Ratio: 5.5 ratio — ABNORMAL HIGH (ref 0.0–5.0)
Cholesterol, Total: 218 mg/dL — ABNORMAL HIGH (ref 100–199)
HDL: 40 mg/dL (ref >39)
LDL Chol Calc (NIH): 123 mg/dL — ABNORMAL HIGH (ref 0–99)
Triglycerides: 311 mg/dL — ABNORMAL HIGH (ref 0–149)
VLDL Cholesterol Cal: 55 mg/dL — ABNORMAL HIGH (ref 5–40)

## 2023-08-16 LAB — HIV ANTIBODY (ROUTINE TESTING W REFLEX)

## 2023-08-16 LAB — TSH: TSH: 1.78 u[IU]/mL (ref 0.450–4.500)

## 2023-08-17 DIAGNOSIS — F1020 Alcohol dependence, uncomplicated: Secondary | ICD-10-CM | POA: Diagnosis not present

## 2023-08-20 DIAGNOSIS — F1020 Alcohol dependence, uncomplicated: Secondary | ICD-10-CM | POA: Diagnosis not present

## 2023-08-29 ENCOUNTER — Encounter: Payer: Self-pay | Admitting: Internal Medicine

## 2023-08-29 DIAGNOSIS — F1020 Alcohol dependence, uncomplicated: Secondary | ICD-10-CM | POA: Diagnosis not present

## 2023-09-26 DIAGNOSIS — F1020 Alcohol dependence, uncomplicated: Secondary | ICD-10-CM | POA: Diagnosis not present

## 2024-01-15 ENCOUNTER — Telehealth: Payer: Self-pay

## 2024-01-15 NOTE — Telephone Encounter (Signed)
 Spoke with [facility] to go over billing information.

## 2024-01-15 NOTE — Telephone Encounter (Signed)
 Copied from CRM #[id number]. Topic: General - Other >> Jan 15, 2024  2:12 PM DeAngela L wrote: Reason for CRM: Sam with [employer] calling to get updated insurance information for the patient for  DOS 08/14/2023 ref# [id number]  Phone num [phone]

## 2024-08-19 ENCOUNTER — Ambulatory Visit: Admitting: Internal Medicine
